# Patient Record
Sex: Female | Born: 2000 | Race: Black or African American | Hispanic: No | Marital: Married | Smoking: Never smoker
Health system: Southern US, Community
[De-identification: ages and names within clinical notes are randomized; demographics above are authoritative.]

---
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
## 2021-09-19 IMAGING — US US PELVIS COMPLETE
1 series · 14 of 25 positions shown · non-contrast
Comparison: None.

CLINICAL DATA: Pelvic and perineal pain for 3 months

EXAM:
TRANSABDOMINAL ULTRASOUND OF PELVIS
TECHNIQUE: Transabdominal ultrasound examination of the pelvis was performed
including evaluation of the uterus, ovaries, adnexal regions, and
pelvic cul-de-sac. Transvaginal imaging was not performed.

[Series 1: us pelvis complete · 0.13mm/px · 14 of 48 slices shown]
[im 1/48]
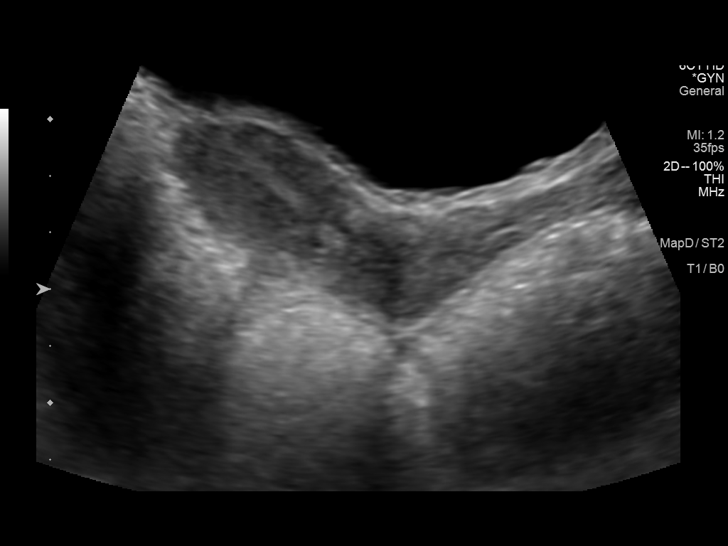
[im 4/48]
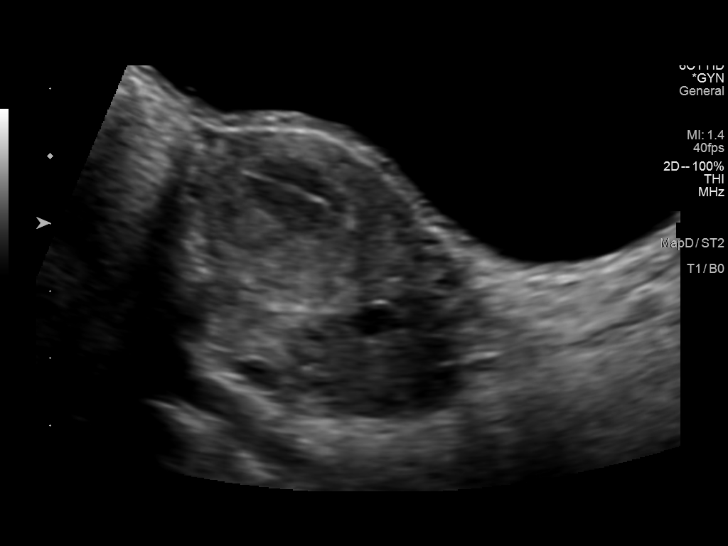
[im 8/48]
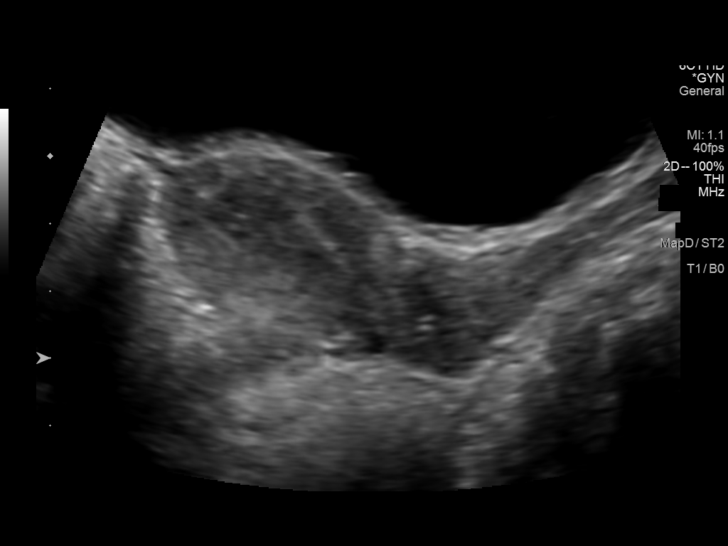
[im 12/48]
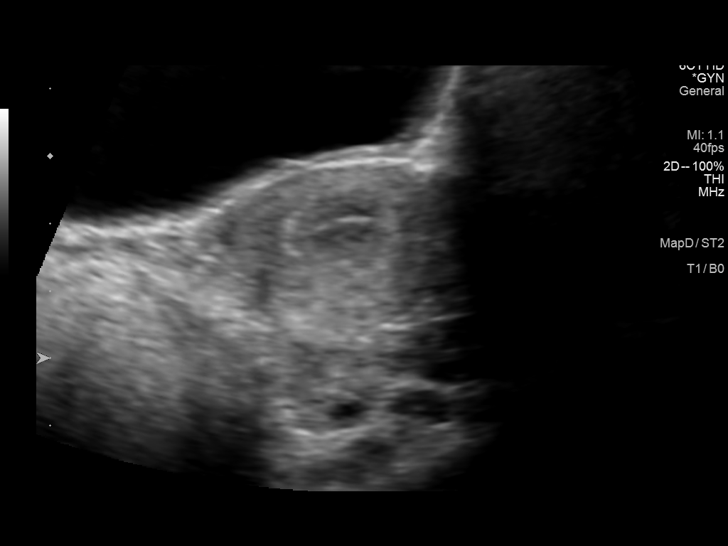
[im 16/48]
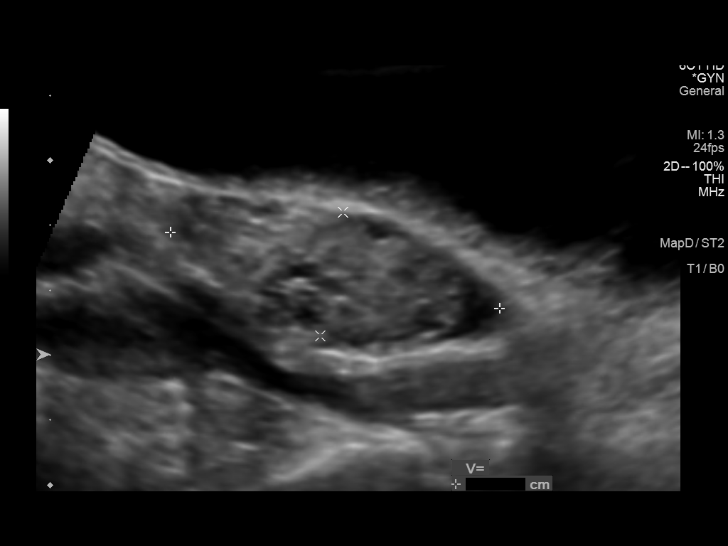
[im 18/48]
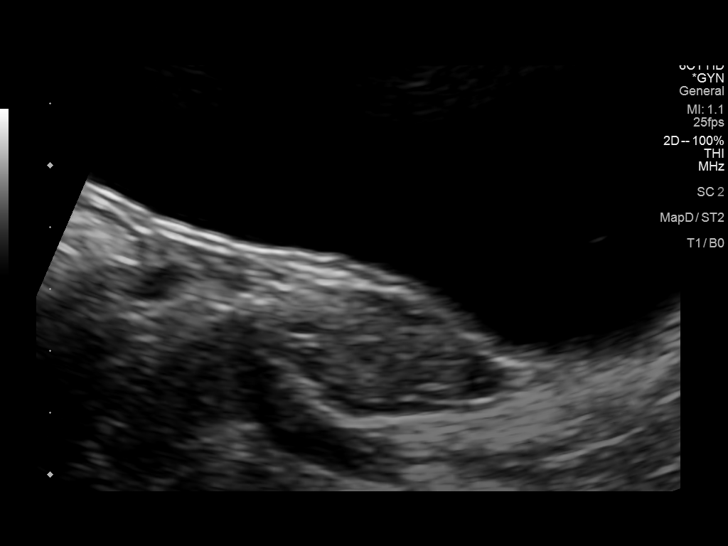
[im 22/48]
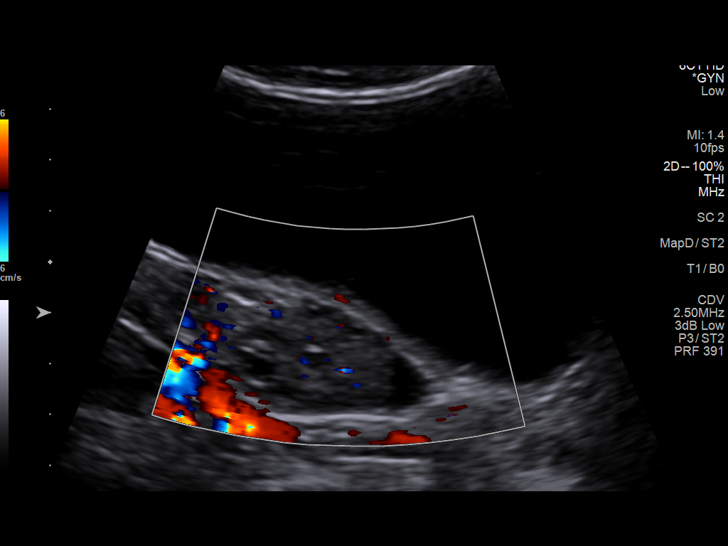
[im 26/48]
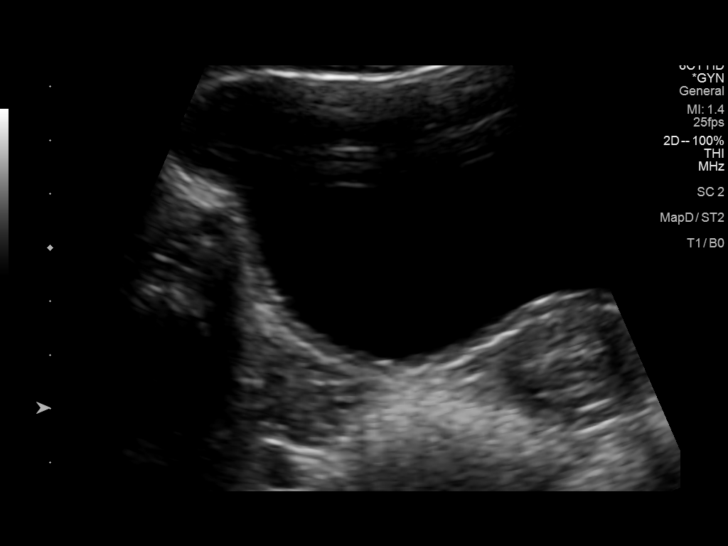
[im 30/48]
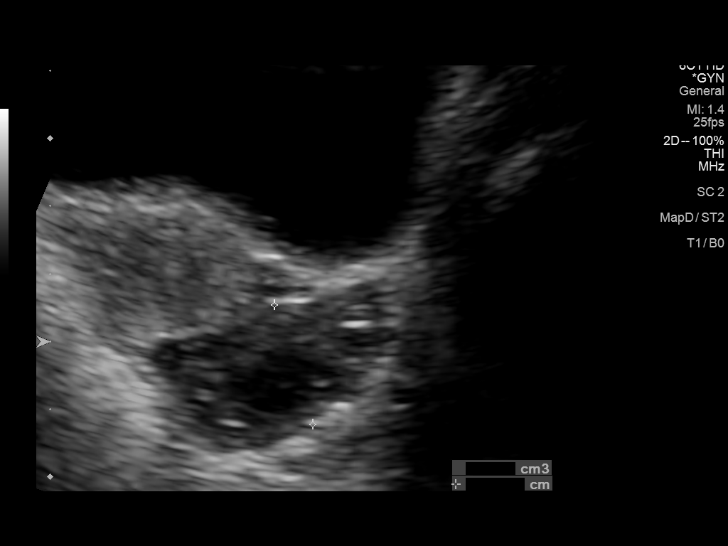
[im 32/48]
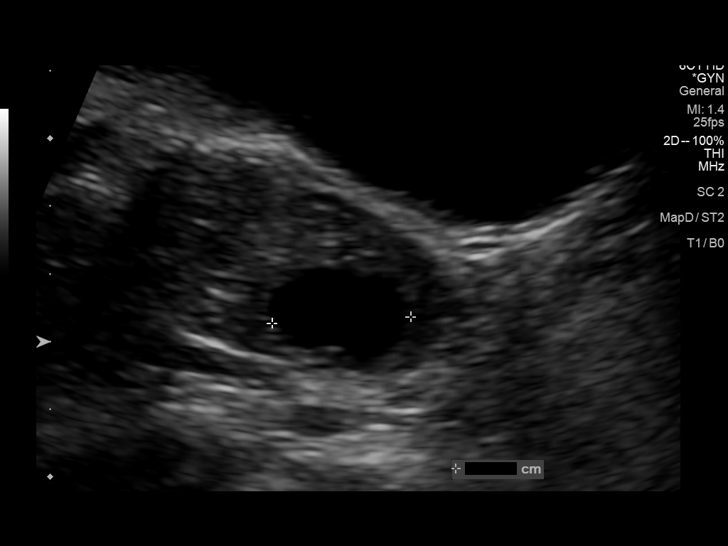
[im 36/48]
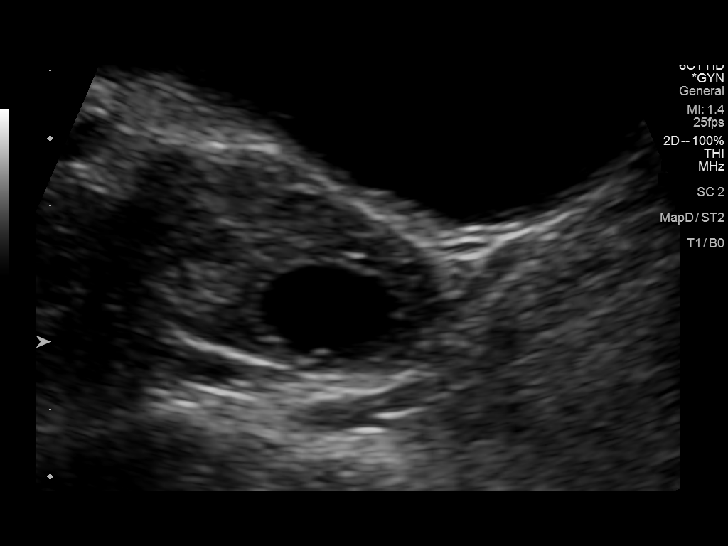
[im 40/48]
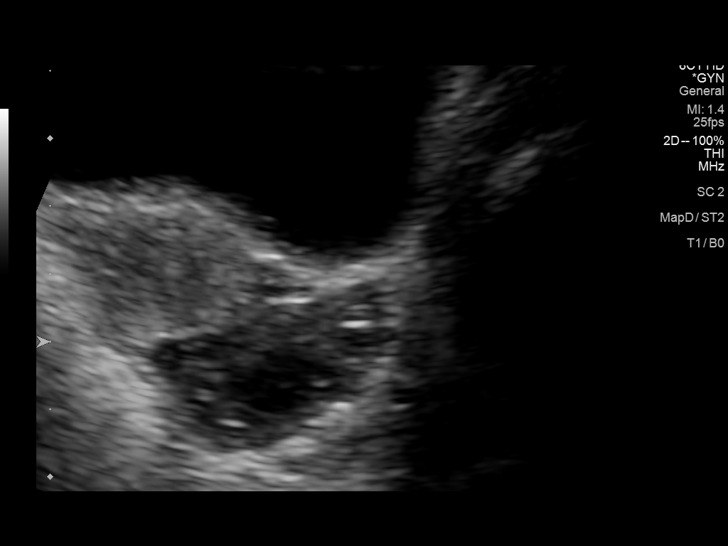
[im 44/48]
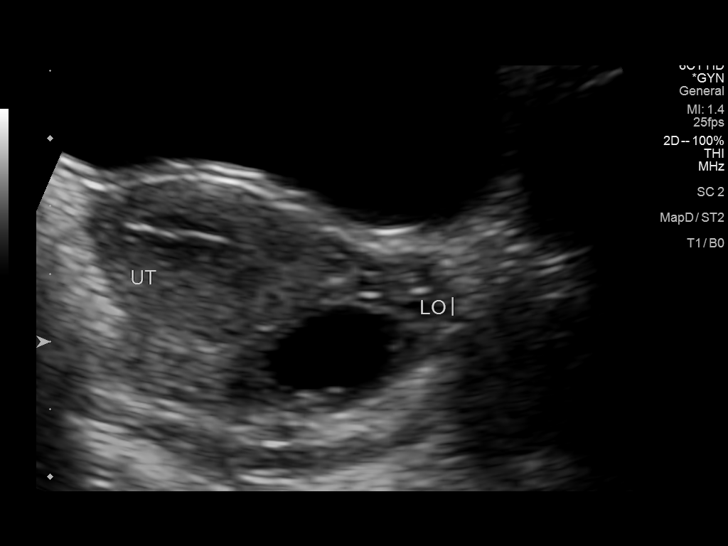
[im 48/48]
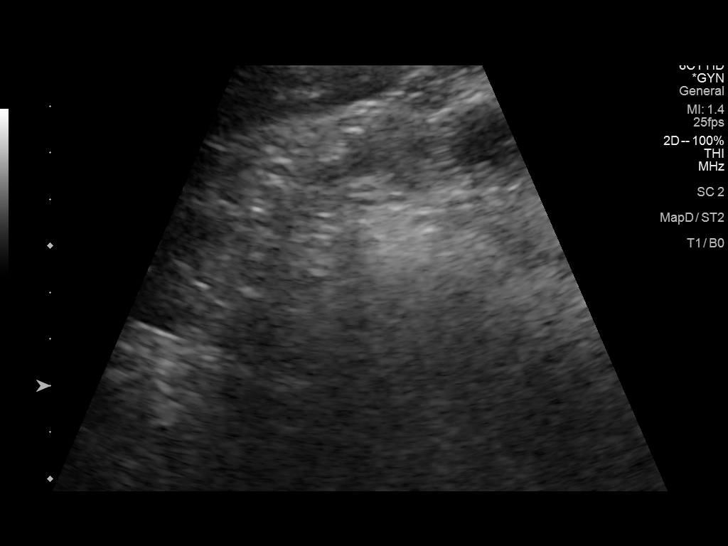

[14 of 25 positions shown; findings below may reference images not displayed]

FINDINGS: Uterus

Measurements: 5.5 x 2.1 x 3.5 cm = volume: 21 mL. Anteverted. Normal
morphology without mass.

Endometrium

Thickness: 5 mm.  No endometrial fluid or focal abnormality

Right ovary

Measurements: 5.2 x 1.9 x 2.0 cm = volume: 10.4 mL. Normal
morphology without mass

Left ovary

Measurements: 5.3 x 2.7 x 1.9 cm = volume: 14.0 mL. Dominant
follicle without mass

Other findings:  No free pelvic fluid or adnexal masses.
IMPRESSION: Normal exam.

## 2022-01-02 ENCOUNTER — Ambulatory Visit (INDEPENDENT_AMBULATORY_CARE_PROVIDER_SITE_OTHER): Payer: Medicaid Other

## 2022-01-02 ENCOUNTER — Other Ambulatory Visit: Payer: Self-pay

## 2022-01-02 VITALS — BP 125/76 | HR 76 | Ht 62.0 in | Wt 123.0 lb

## 2022-01-02 DIAGNOSIS — Z3201 Encounter for pregnancy test, result positive: Secondary | ICD-10-CM | POA: Diagnosis not present

## 2022-01-02 LAB — POCT URINE PREGNANCY: Preg Test, Ur: POSITIVE — AB

## 2022-01-02 NOTE — Progress Notes (Signed)
Maria Larsen presents today for UPT. She has no unusual complaints. LMP: 11/06/2021    OBJECTIVE: Appears well, in no apparent distress.  OB History     Gravida  1   Para      Term      Preterm      AB      Living         SAB      IAB      Ectopic      Multiple      Live Births             Home UPT Result:POSITIVE In-Office UPT result:POSITIVE  I have reviewed the patient's medical, obstetrical, social, and family histories, and medications.   ASSESSMENT: Positive pregnancy test LMP  11/06/2021 EDD  08/13/2022 GA     [redacted]w[redacted]d   PLAN Prenatal care to be completed at: [facility]

## 2022-01-02 NOTE — Progress Notes (Signed)
Agree with nurses's documentation of this patient's clinic encounter.  Nehal Shives L, MD  

## 2022-01-07 ENCOUNTER — Ambulatory Visit (INDEPENDENT_AMBULATORY_CARE_PROVIDER_SITE_OTHER): Payer: Medicaid Other

## 2022-01-07 ENCOUNTER — Other Ambulatory Visit: Payer: Self-pay

## 2022-01-07 VITALS — BP 119/78 | HR 71 | Ht 62.0 in | Wt 127.6 lb

## 2022-01-07 DIAGNOSIS — Z3401 Encounter for supervision of normal first pregnancy, first trimester: Secondary | ICD-10-CM

## 2022-01-07 DIAGNOSIS — Z3400 Encounter for supervision of normal first pregnancy, unspecified trimester: Secondary | ICD-10-CM | POA: Insufficient documentation

## 2022-01-07 MED ORDER — VITAFOL GUMMIES 3.33-0.333-34.8 MG PO CHEW: 3.0000 | CHEWABLE_TABLET | Freq: Every day | ORAL | 11 refills | Status: DC

## 2022-01-07 MED ORDER — BLOOD PRESSURE KIT DEVI: 1.0000 | 0 refills | Status: AC

## 2022-01-07 NOTE — Progress Notes (Signed)
New OB Intake  I connected with  Maria Larsen on 01/07/22 at 10:15 AM EST by in person Video Visit and verified that I am speaking with the correct person using two identifiers. Nurse is located at [facility] and pt is located at [place].  I discussed the limitations, risks, security and privacy concerns of performing an evaluation and management service by telephone and the availability of in person appointments. I also discussed with the patient that there may be a patient responsible charge related to this service. The patient expressed understanding and agreed to proceed.  I explained I am completing New OB Intake today. We discussed her EDD undetermined at this time due to dates being off. GS measuring [redacted]w[redacted]d with a possible fetal pole measuring [redacted]w[redacted]d. Patient to return in 2 weeks for repeat dating and viability scan. Pt is G1/P0. I reviewed her allergies, medications, Medical/Surgical/OB history, and appropriate screenings. I informed her of [facility] services. Based on history, this is a/an  Maria uncomplicated .   There are no problems to display for this patient.   Concerns addressed today  Delivery Plans:  Plans to deliver at [facility].   MyChart/Babyscripts MyChart access verified. I explained pt will have some visits in office and some virtually. Babyscripts instructions given and order placed. Patient verifies receipt of registration text/e-mail. Account successfully created and app downloaded.  Blood Pressure Cuff  Blood pressure cuff ordered for patient to pick-up from [facility]. Explained after first prenatal appt pt will check weekly and document in Babyscripts.  Weight scale: Patient does have weight scale. Anatomy US Explained first scheduled [place] will be around 19 weeks. Dating and viability scan performed today.  Labs Discussed [facility] genetic screening with patient. Would like both Panorama and Horizon drawn at new OB visit.Also if interested in genetic testing, tell  patient she will need AFP 15-21 weeks to complete genetic testing .Routine prenatal labs needed.  Covid Vaccine Patient has not covid vaccine.    Informed patient of [facility] website  and placed link in her AVS.   Social Determinants of Health Food Insecurity: Patient denies food insecurity. WIC Referral: Patient is not interested in referral to [facility].  Transportation: Patient denies transportation needs. Childcare: Discussed no children allowed at ultrasound appointments. Offered childcare services; patient declines childcare services at this time.  Send link to [facility]   Placed OB Box on problem list and updated  First visit review I reviewed new OB appt with pt. I explained she will have a pelvic exam, ob bloodwork with genetic screening, and PAP smear. Explained pt will be seen by Maria [facility]en at first visit; encounter routed to appropriate provider. Explained that patient will be seen by Maria navigator following visit with provider. [facility] information placed in AVS.   Maria Capri, RN 01/07/2022  10:16 AM

## 2022-01-21 ENCOUNTER — Ambulatory Visit (INDEPENDENT_AMBULATORY_CARE_PROVIDER_SITE_OTHER): Payer: Medicaid Other

## 2022-01-21 ENCOUNTER — Encounter: Payer: Self-pay | Admitting: Obstetrics

## 2022-01-21 ENCOUNTER — Other Ambulatory Visit: Payer: Self-pay

## 2022-01-21 ENCOUNTER — Ambulatory Visit (INDEPENDENT_AMBULATORY_CARE_PROVIDER_SITE_OTHER): Payer: PRIVATE HEALTH INSURANCE

## 2022-01-21 DIAGNOSIS — Z3401 Encounter for supervision of normal first pregnancy, first trimester: Secondary | ICD-10-CM

## 2022-01-21 DIAGNOSIS — O3680X0 Pregnancy with inconclusive fetal viability, not applicable or unspecified: Secondary | ICD-10-CM

## 2022-01-21 NOTE — Progress Notes (Cosign Needed)
DATING AND VIABILITY SONOGRAM ? ? ?Maria Larsen is a 21 y.o. year old G1P0 with LMP Patient's last menstrual period was 11/01/2021 (exact date). which would correlate to  [redacted]w[redacted]d weeks gestation.  She has regular menstrual cycles.   She is here today for a confirmatory initial sonogram. ? ? ? ?GESTATION: ?SINGLETON pregnancy    ? ?FETAL ACTIVITY: ?         Heart rate         169 ?         The fetus is inactive. ? ? ?GESTATIONAL AGE AND  BIOMETRICS: ? ?Gestational criteria: Estimated Date of Delivery: 09/03/22 by early ultrasound now at [redacted]w[redacted]d. ? ?Previous Scans:1 ? ?    ?CROWN RUMP LENGTH           15.1 mm         7 weeks  ?    ?    ?    ?    ?    ?    ?    ? ?                                                AVERAGE EGA(BY THIS SCAN):  7 weeks ? ?WORKING EDD( early ultrasound ):  09/03/22 ?  ? ? ?TECHNICIAN COMMENTS: ? ?Single live IUP at [redacted]w[redacted]d by early u/s.  ? ? ?A copy of this report including all images has been saved and backed up to a second source for retrieval if needed. All measures and details of the anatomical scan, placentation, fluid volume and pelvic anatomy are contained in that report. ? ?[name] ?01/21/2022 ?2:37 PM ?  ?

## 2022-02-28 ENCOUNTER — Ambulatory Visit (INDEPENDENT_AMBULATORY_CARE_PROVIDER_SITE_OTHER): Payer: Medicaid Other | Admitting: Obstetrics and Gynecology

## 2022-02-28 ENCOUNTER — Other Ambulatory Visit (HOSPITAL_COMMUNITY)
Admission: RE | Admit: 2022-02-28 | Discharge: 2022-02-28 | Disposition: A | Discharge status: Home / Self Care | Payer: Medicaid Other | Source: Ambulatory Visit | Attending: Obstetrics and Gynecology | Admitting: Obstetrics and Gynecology

## 2022-02-28 ENCOUNTER — Encounter: Payer: Self-pay | Admitting: Obstetrics and Gynecology

## 2022-02-28 VITALS — BP 118/71 | HR 70 | Wt 120.2 lb

## 2022-02-28 DIAGNOSIS — Z3A13 13 weeks gestation of pregnancy: Secondary | ICD-10-CM

## 2022-02-28 DIAGNOSIS — Z3401 Encounter for supervision of normal first pregnancy, first trimester: Secondary | ICD-10-CM | POA: Diagnosis present

## 2022-02-28 NOTE — Progress Notes (Signed)
? ?INITIAL OBSTETRICAL VISIT ?Patient name: Maria Larsen MRN [id number]  Date of birth: Apr 25, 2001 ?Chief Complaint:   ?Initial Prenatal Visit ? ?History of Present Illness:   ?Maria Larsen is a 21 y.o. G1P0 African American female at [redacted]w[redacted]d by 7.6 wks [place] with an Estimated Date of Delivery: 09/03/22 being seen today for her initial obstetrical visit.  Her obstetrical history is significant for  none . This is a planned pregnancy. She and her husband (the father of the baby (FOB)) "Bernette Redbird" live together. She has a support system that consists of her husband/family/friends. ?Today she reports  constipation and bloating . She reports vomiting was from weeks 5-10; occ nausea now. ? ?Patient's last menstrual period was 11/01/2021 (exact date). ?Last pap n/a. Results were:  n/a ?Review of Systems:   ?Pertinent items are noted in HPI ?Denies cramping/contractions, leakage of fluid, vaginal bleeding, abnormal vaginal discharge w/ itching/odor/irritation, headaches, visual changes, shortness of breath, chest pain, abdominal pain, severe nausea/vomiting, or problems with urination or bowel movements unless otherwise stated above.  ?Pertinent History Reviewed:  ?Reviewed past medical,surgical, social, obstetrical and family history.  ?Reviewed problem list, medications and allergies. ?OB History  ?Gravida Para Term Preterm AB Living  ?1            ?SAB IAB Ectopic Multiple Live Births  ?           ?  ?# Outcome Date GA Lbr Len/2nd Weight Sex Delivery Anes PTL Lv  ?1 Current           ? ?Physical Assessment:  ? ?Vitals:  ? 02/28/22 0826  ?BP: 118/71  ?Pulse: 70  ?Weight: 120 lb 3.2 oz (54.5 kg)  ?Body mass index is 21.98 kg/m?. ? ?     Physical Examination: ? General appearance - well appearing, and in no distress ? Mental status - alert, oriented to person, place, and time ? Psych:  She has a normal mood and affect ? Skin - warm and dry, normal color, no suspicious lesions noted ? Chest - effort normal, all lung fields clear to  auscultation bilaterally ? Heart - normal rate and regular rhythm ? Abdomen - soft, nontender ? Extremities:  No swelling or varicosities noted ? Pelvic - VULVA: normal appearing vulva with no masses, tenderness or lesions  VAGINA: normal appearing vagina with normal color and discharge, no lesions.   CERVIX: normal appearing cervix without discharge or lesions, no CMT ? ?Bedside Ultrasound: ?Patient informed that the ultrasound is considered a limited OB ultrasound and is not intended to be a complete ultrasound exam.  Patient also informed that the ultrasound is not being completed with the intent of assessing for fetal or placental anomalies or any pelvic abnormalities.  Explained that the purpose of today?s ultrasound is to assess for viability. FHTs by U/S: 130 bpm. Baby was found to be in a cephalic presentation. Patient acknowledges the purpose of the exam and the limitations of the study. ?  ?Assessment & Plan:  ?1) Low-Risk Pregnancy G1P0 at [redacted]w[redacted]d with an Estimated Date of Delivery: 09/03/22  ? ?2) Initial OB visit ?- Welcomed to practice and introduced self to patient in addition to discussing other advanced practice providers that she may be seeing at this practice ?- Congratulated patient ?- Anticipatory guidance on upcoming appointments ?- Educated on COVID19 and pregnancy and the integration of virtual appointments  ?- Educated on babyscripts app- patient reports she has not received email, encouraged to look in spam folder and to call  office if she still has not received email - patient verbalizes understanding ?   ?3) Encounter for supervision of normal first pregnancy in first trimester ?- Can continue vegan diet, but ensure she is getting good protein sources with there vegan choices. ?  ? ?Meds: No orders of the defined types were placed in this encounter. ? ? ?Initial labs obtained ?Continue prenatal vitamins ?Reviewed n/v relief measures and warning s/s to report ?Reviewed recommended weight  gain based on pre-gravid BMI ?Encouraged well-balanced diet ?Genetic Screening discussed: ordered ?Cystic fibrosis, SMA, Fragile X screening discussed ordered ?The nature of [facility] with multiple MDs and other Advanced Practice Providers was explained to patient; also emphasized that residents, students are part of our team.  ?Discussed optimized OB schedule and video visits. Advised can have an in-office visit whenever she feels she needs to be seen.  ?Does not have own BP cuff. BP cuff Rx faxed today. Explained to patient that BP will be mailed to her house. Check BP weekly, let us know if >140/90. ?Advised to call during normal business hours and there is an after-hours nurse line available.  ? ? ?Follow-up: Return in about 4 weeks (around 03/28/2022) for Return OB visit.  ? ?No orders of the defined types were placed in this encounter. ? ? ?Raelyn Mora MSN, CNM ?02/28/2022 ? ?

## 2022-02-28 NOTE — Progress Notes (Signed)
Pt presents for NOB visit without complaints today.  

## 2022-03-01 LAB — CERVICOVAGINAL ANCILLARY ONLY
Bacterial Vaginitis (gardnerella): NEGATIVE
Candida Glabrata: NEGATIVE
Candida Vaginitis: NEGATIVE
Chlamydia: NEGATIVE
Comment: NEGATIVE
Comment: NEGATIVE
Comment: NEGATIVE
Comment: NEGATIVE
Comment: NEGATIVE
Comment: NORMAL
Neisseria Gonorrhea: NEGATIVE
Trichomonas: NEGATIVE

## 2022-03-01 LAB — CBC/D/PLT+RPR+RH+ABO+RUBIGG...
Antibody Screen: NEGATIVE
Basophils Absolute: 0 10*3/uL (ref 0.0–0.2)
Basos: 1 %
EOS (ABSOLUTE): 0.1 10*3/uL (ref 0.0–0.4)
Eos: 1 %
HCV Ab: NONREACTIVE
HIV Screen 4th Generation wRfx: NONREACTIVE
Hematocrit: 41 % (ref 34.0–46.6)
Hemoglobin: 14.2 g/dL (ref 11.1–15.9)
Hepatitis B Surface Ag: NEGATIVE
Immature Grans (Abs): 0 10*3/uL (ref 0.0–0.1)
Immature Granulocytes: 0 %
Lymphocytes Absolute: 1.3 10*3/uL (ref 0.7–3.1)
Lymphs: 21 %
MCH: 30.3 pg (ref 26.6–33.0)
MCHC: 34.6 g/dL (ref 31.5–35.7)
MCV: 87 fL (ref 79–97)
Monocytes Absolute: 0.4 10*3/uL (ref 0.1–0.9)
Monocytes: 7 %
Neutrophils Absolute: 4.2 10*3/uL (ref 1.4–7.0)
Neutrophils: 70 %
Platelets: 255 10*3/uL (ref 150–450)
RBC: 4.69 x10E6/uL (ref 3.77–5.28)
RDW: 13.3 % (ref 11.7–15.4)
RPR Ser Ql: NONREACTIVE
Rh Factor: POSITIVE
Rubella Antibodies, IGG: 23.6 index (ref >0.99)
WBC: 6 10*3/uL (ref 3.4–10.8)

## 2022-03-01 LAB — HCV INTERPRETATION

## 2022-03-01 LAB — HEMOGLOBIN A1C
Est. average glucose Bld gHb Est-mCnc: 94 mg/dL
Hgb A1c MFr Bld: 4.9 % (ref 4.8–5.6)

## 2022-03-02 LAB — URINE CULTURE, OB REFLEX: Organism ID, Bacteria: NO GROWTH

## 2022-03-02 LAB — CULTURE, OB URINE

## 2022-03-11 ENCOUNTER — Encounter: Payer: Self-pay | Admitting: Obstetrics and Gynecology

## 2022-03-12 ENCOUNTER — Telehealth: Payer: Self-pay

## 2022-03-12 NOTE — Telephone Encounter (Signed)
Breast pump order received from [employer]. ? ?Form signed and faxed back. ?Confirmation received.  ?

## 2022-03-13 ENCOUNTER — Encounter: Payer: Self-pay | Admitting: Obstetrics and Gynecology

## 2022-03-13 ENCOUNTER — Telehealth (INDEPENDENT_AMBULATORY_CARE_PROVIDER_SITE_OTHER): Payer: Medicaid Other | Admitting: Obstetrics and Gynecology

## 2022-03-13 DIAGNOSIS — Z3401 Encounter for supervision of normal first pregnancy, first trimester: Secondary | ICD-10-CM

## 2022-03-13 DIAGNOSIS — Z3A15 15 weeks gestation of pregnancy: Secondary | ICD-10-CM

## 2022-03-13 NOTE — Progress Notes (Signed)
? ?  OBSTETRICS PRENATAL VIRTUAL VISIT ENCOUNTER NOTE ? ?Provider location: Center for [employer] at [place]  ? ?Patient location: Home ? ?I connected with Maria Larsen on 03/13/22 at 11:15 AM EDT by MyChart Video Encounter and verified that I am speaking with the correct person using two identifiers. I discussed the limitations, risks, security and privacy concerns of performing an evaluation and management service virtually and the availability of in person appointments. I also discussed with the patient that there may be a patient responsible charge related to this service. The patient expressed understanding and agreed to proceed. ?Subjective:  ?Maria Larsen is a 21 y.o. G1P0 at [redacted]w[redacted]d being seen today for ongoing prenatal care.  She is currently monitored for the following issues for this low-risk pregnancy and has Supervision of normal first pregnancy on their problem list. ? ?Patient reports no complaints.  Contractions: Not present. Vag. Bleeding: None.  Movement: Present. Denies any leaking of fluid.  ? ?The following portions of the patient's history were reviewed and updated as appropriate: allergies, current medications, past family history, past medical history, past social history, past surgical history and problem list.  ? ?Objective:  ?There were no vitals filed for this visit. ? ?Fetal Status:     Movement: Present    ? ?General:  Alert, oriented and cooperative. Patient is in no acute distress.  ?Respiratory: Normal respiratory effort, no problems with respiration noted  ?Mental Status: Normal mood and affect. Normal behavior. Normal judgment and thought content.  ?Rest of physical exam deferred due to type of encounter ? ?Imaging: ?No results found. ? ?Assessment and Plan:  ?Pregnancy: G1P0 at [redacted]w[redacted]d ?1. Encounter for supervision of normal first pregnancy in first trimester ?Stable ?Declines AFP ?Note for jury duty ?Anatomy scan end of month ? ?Preterm labor symptoms and general obstetric  precautions including but not limited to vaginal bleeding, contractions, leaking of fluid and fetal movement were reviewed in detail with the patient. ?I discussed the assessment and treatment plan with the patient. The patient was provided an opportunity to ask questions and all were answered. The patient agreed with the plan and demonstrated an understanding of the instructions. The patient was advised to call back or seek an in-person office evaluation/go to MAU at [facility] for any urgent or concerning symptoms. ?Please refer to After Visit Summary for other counseling recommendations.  ? ?I provided 8 minutes of face-to-face time during this encounter. ? ?Return in about 4 weeks (around 04/10/2022) for OB visit, face to face, any provider. ? ?Future Appointments  ?Date Time Provider Department Center  ?03/28/2022  4:10 PM Gerrit Heck, CNM [facility] None  ?04/09/2022 10:45 AM [facility] US5 [facility]  ? ? ?Hermina Staggers, MD ?Center for [facility], [facility] ?

## 2022-03-28 ENCOUNTER — Telehealth (INDEPENDENT_AMBULATORY_CARE_PROVIDER_SITE_OTHER): Payer: Medicaid Other

## 2022-03-28 DIAGNOSIS — Z3A17 17 weeks gestation of pregnancy: Secondary | ICD-10-CM | POA: Diagnosis not present

## 2022-03-28 DIAGNOSIS — Z3402 Encounter for supervision of normal first pregnancy, second trimester: Secondary | ICD-10-CM | POA: Diagnosis not present

## 2022-03-28 DIAGNOSIS — D573 Sickle-cell trait: Secondary | ICD-10-CM

## 2022-03-28 NOTE — Progress Notes (Signed)
   OBSTETRICS PRENATAL VIRTUAL VISIT ENCOUNTER NOTE  Provider location: [facility] for [facility]   Patient location: Home  I connected with Maria Larsen on 03/28/22 at  4:10 PM EDT by MyChart Video Encounter and verified that I am speaking with the correct person using two identifiers. I discussed the limitations, risks, security and privacy concerns of performing an evaluation and management service virtually and the availability of in person appointments. I also discussed with the patient that there may be a patient responsible charge related to this service. The patient expressed understanding and agreed to proceed. Subjective:  Maria Larsen is a 21 y.o. G1P0 at [redacted]w[redacted]d being seen today for ongoing prenatal care.  She is currently monitored for the following issues for this low-risk pregnancy and has Supervision of normal first pregnancy on their problem list.  Patient reports no complaints. She reports some HA and bloating.  She denies constipation. She denies movement, but endorses flutters. She denies issues with urination. She reports some nipple discharge/milk production with breast manipulation. Patient questions if she should start pumping! Contractions: Not present. Vag. Bleeding: None.  Movement: Present. Denies any leaking of fluid.   The following portions of the patient's history were reviewed and updated as appropriate: allergies, current medications, past family history, past medical history, past social history, past surgical history and problem list.   Objective:  There were no vitals filed for this visit.  Fetal Status:     Movement: Present     General:  Alert, oriented and cooperative. Patient is in no acute distress.  Respiratory: Normal respiratory effort, no problems with respiration noted  Mental Status: Normal mood and affect. Normal behavior. Normal judgment and thought content.  Rest of physical exam deferred due to type of encounter  Imaging: No  results found.  Assessment and Plan:  Pregnancy: G1P0 at [redacted]w[redacted]d 1. Encounter for supervision of normal first pregnancy in second trimester -Anticipatory guidance for upcoming appts. -Patient to schedule next appt in 3-4 weeks for an in-person visit. -Patient declines AFP. -Reviewed [facility]. Shown, via screen share, on how to contact for partner testing.  -[place] trait/carrier added to problem list.  2. [redacted] weeks gestation of pregnancy -Doing well. -Reviewed concern with breast production. Reassured normal finding.  -Instructed to not start pumping.  Discussed ways to decrease breast stimulation including maternity bra, indirect heat while in shower, no manipulation of nipple.  Preterm labor symptoms and general obstetric precautions including but not limited to vaginal bleeding, contractions, leaking of fluid and fetal movement were reviewed in detail with the patient. I discussed the assessment and treatment plan with the patient. The patient was provided an opportunity to ask questions and all were answered. The patient agreed with the plan and demonstrated an understanding of the instructions. The patient was advised to call back or seek an in-person office evaluation/go to MAU at [facility] for any urgent or concerning symptoms. Please refer to After Visit Summary for other counseling recommendations.   I provided 14 minutes of face-to-face time during this encounter.  No follow-ups on file.  Future Appointments  Date Time Provider [place]  04/09/2022 10:45 AM [facility] US5 [facility] [place], [place] for [employer], [place]

## 2022-03-28 NOTE — Progress Notes (Signed)
Pt presents for virtual ROB visit. Pt states she feels she is producing breast milk and her breast are cramping and engorged and she is having some leakage if she squeezes them.

## 2022-04-09 ENCOUNTER — Ambulatory Visit: Payer: PRIVATE HEALTH INSURANCE | Attending: Obstetrics and Gynecology

## 2022-04-24 ENCOUNTER — Encounter: Payer: Medicaid Other | Admitting: Student
